# Patient Record
Sex: Male | Born: 1965 | Race: White | Hispanic: No | Marital: Married | Smoking: Former smoker
Health system: Southern US, Community
[De-identification: ages and names within clinical notes are randomized; demographics above are authoritative.]

## PROBLEM LIST (undated history)

## (undated) DIAGNOSIS — E119 Type 2 diabetes mellitus without complications: Secondary | ICD-10-CM

## (undated) DIAGNOSIS — C801 Malignant (primary) neoplasm, unspecified: Secondary | ICD-10-CM

## (undated) DIAGNOSIS — E785 Hyperlipidemia, unspecified: Secondary | ICD-10-CM

## (undated) HISTORY — PX: EYE SURGERY: SHX253

## (undated) HISTORY — PX: VASECTOMY: SHX75

## (undated) HISTORY — PX: WISDOM TOOTH EXTRACTION: SHX21

## (undated) HISTORY — DX: Malignant (primary) neoplasm, unspecified: C80.1

## (undated) HISTORY — DX: Hyperlipidemia, unspecified: E78.5

## (undated) HISTORY — DX: Type 2 diabetes mellitus without complications: E11.9

---

## 2017-07-19 ENCOUNTER — Encounter: Payer: Self-pay | Admitting: Gastroenterology

## 2017-09-09 ENCOUNTER — Ambulatory Visit (AMBULATORY_SURGERY_CENTER): Payer: Self-pay | Admitting: *Deleted

## 2017-09-09 VITALS — Ht 75.0 in | Wt 231.0 lb

## 2017-09-09 DIAGNOSIS — Z1211 Encounter for screening for malignant neoplasm of colon: Secondary | ICD-10-CM

## 2017-09-09 MED ORDER — NA SULFATE-K SULFATE-MG SULF 17.5-3.13-1.6 GM/177ML PO SOLN: 1.0000 | Freq: Once | ORAL | 0 refills | Status: AC

## 2017-09-09 NOTE — Progress Notes (Signed)
Patient denies any allergies to eggs or soy. Patient denies any problems with anesthesia/sedation. Patient denies any oxygen use at home. Patient denies taking any diet/weight loss medications or blood thinners. EMMI education offered, pt declined. Suprep covered by [employer].

## 2017-09-12 HISTORY — PX: COLONOSCOPY: SHX174

## 2017-09-23 ENCOUNTER — Encounter: Payer: Self-pay | Admitting: Gastroenterology

## 2017-09-23 ENCOUNTER — Ambulatory Visit (AMBULATORY_SURGERY_CENTER): Payer: PRIVATE HEALTH INSURANCE | Admitting: Gastroenterology

## 2017-09-23 VITALS — BP 101/66 | HR 73 | Temp 98.4°F | Resp 14 | Ht 75.0 in | Wt 231.0 lb

## 2017-09-23 DIAGNOSIS — D123 Benign neoplasm of transverse colon: Secondary | ICD-10-CM

## 2017-09-23 DIAGNOSIS — C182 Malignant neoplasm of ascending colon: Secondary | ICD-10-CM | POA: Diagnosis not present

## 2017-09-23 DIAGNOSIS — C187 Malignant neoplasm of sigmoid colon: Secondary | ICD-10-CM | POA: Diagnosis not present

## 2017-09-23 DIAGNOSIS — C801 Malignant (primary) neoplasm, unspecified: Secondary | ICD-10-CM

## 2017-09-23 DIAGNOSIS — D125 Benign neoplasm of sigmoid colon: Secondary | ICD-10-CM

## 2017-09-23 DIAGNOSIS — Z1211 Encounter for screening for malignant neoplasm of colon: Secondary | ICD-10-CM

## 2017-09-23 DIAGNOSIS — K635 Polyp of colon: Secondary | ICD-10-CM

## 2017-09-23 HISTORY — DX: Malignant (primary) neoplasm, unspecified: C80.1

## 2017-09-23 MED ORDER — SODIUM CHLORIDE 0.9 % IV SOLN: 500.0000 mL | Freq: Once | INTRAVENOUS | Status: DC

## 2017-09-23 NOTE — Progress Notes (Signed)
To PACU, VSS. Report to RN.tb 

## 2017-09-23 NOTE — Progress Notes (Signed)
Called to room to assist during endoscopic procedure.  Patient ID and intended procedure confirmed with present staff. Received instructions for my participation in the procedure from the performing physician.  

## 2017-09-23 NOTE — Patient Instructions (Signed)
Handouts given on polyps, diverticulosis and clip card   YOU HAD AN ENDOSCOPIC PROCEDURE TODAY AT [facility]:   Refer to the procedure report that was given to you for any specific questions about what was found during the examination.  If the procedure report does not answer your questions, please call your gastroenterologist to clarify.  If you requested that your care partner not be given the details of your procedure findings, then the procedure report has been included in a sealed envelope for you to review at your convenience later.  YOU SHOULD EXPECT: Some feelings of bloating in the abdomen. Passage of more gas than usual.  Walking can help get rid of the air that was put into your GI tract during the procedure and reduce the bloating. If you had a lower endoscopy (such as a colonoscopy or flexible sigmoidoscopy) you may notice spotting of blood in your stool or on the toilet paper. If you underwent a bowel prep for your procedure, you may not have a normal bowel movement for a few days.  Please Note:  You might notice some irritation and congestion in your nose or some drainage.  This is from the oxygen used during your procedure.  There is no need for concern and it should clear up in a day or so.  SYMPTOMS TO REPORT IMMEDIATELY:   Following lower endoscopy (colonoscopy or flexible sigmoidoscopy):  Excessive amounts of blood in the stool  Significant tenderness or worsening of abdominal pains  Swelling of the abdomen that is new, acute  Fever of 100F or higher   For urgent or emergent issues, a gastroenterologist can be reached at any hour by calling [phone].   DIET:  We do recommend a small meal at first, but then you may proceed to your regular diet.  Drink plenty of fluids but you should avoid alcoholic beverages for 24 hours.  ACTIVITY:  You should plan to take it easy for the rest of today and you should NOT DRIVE or use heavy machinery until tomorrow  (because of the sedation medicines used during the test).    FOLLOW UP: Our staff will call the number listed on your records the next business day following your procedure to check on you and address any questions or concerns that you may have regarding the information given to you following your procedure. If we do not reach you, we will leave a message.  However, if you are feeling well and you are not experiencing any problems, there is no need to return our call.  We will assume that you have returned to your regular daily activities without incident.  If any biopsies were taken you will be contacted by phone or by letter within the next 1-3 weeks.  Please call us at [phone] if you have not heard about the biopsies in 3 weeks.    SIGNATURES/CONFIDENTIALITY: You and/or your care partner have signed paperwork which will be entered into your electronic medical record.  These signatures attest to the fact that that the information above on your After Visit Summary has been reviewed and is understood.  Full responsibility of the confidentiality of this discharge information lies with you and/or your care-partner.

## 2017-09-23 NOTE — Progress Notes (Signed)
Pt's states no medical or surgical changes since previsit or office visit. 

## 2017-09-23 NOTE — Op Note (Signed)
[place] Patient Name: Luke Woodard Procedure Date: 09/23/2017 8:05 AM MRN: [id number] Endoscopist: Mallie Mussel L. [name] , MD Age: 52 Referring MD:  Date of Birth: 02-18-1965 Gender: Male Account #: [id number] Procedure:                Colonoscopy Indications:              Screening for colorectal malignant neoplasm, This                            is the patient's first colonoscopy Medicines:                Monitored Anesthesia Care Procedure:                Pre-Anesthesia Assessment:                           - Prior to the procedure, a History and Physical                            was performed, and patient medications and                            allergies were reviewed. The patient's tolerance of                            previous anesthesia was also reviewed. The risks                            and benefits of the procedure and the sedation                            options and risks were discussed with the patient.                            All questions were answered, and informed consent                            was obtained. Prior Anticoagulants: The patient has                            taken no previous anticoagulant or antiplatelet                            agents. ASA Grade Assessment: II - A patient with                            mild systemic disease. After reviewing the risks                            and benefits, the patient was deemed in                            satisfactory condition to undergo the procedure.  After obtaining informed consent, the colonoscope                            was passed under direct vision. Throughout the                            procedure, the patient's blood pressure, pulse, and                            oxygen saturations were monitored continuously. The                            Colonoscope was introduced through the anus and                            advanced to the the cecum,  identified by                            appendiceal orifice and ileocecal valve. The                            colonoscopy was performed without difficulty. The                            patient tolerated the procedure well. The quality                            of the bowel preparation was excellent. The                            ileocecal valve, appendiceal orifice, and rectum                            were photographed. Scope In: 8:13:00 AM Scope Out: 8:31:31 AM Scope Withdrawal Time: 0 hours 16 minutes 18 seconds  Total Procedure Duration: 0 hours 18 minutes 31 seconds  Findings:                 The perianal and digital rectal examinations were                            normal.                           A 2 mm polyp was found in the transverse colon. The                            polyp was sessile. The polyp was removed with a                            cold biopsy forceps. Resection and retrieval were                            complete.  Diverticula were found in the left colon.                           A 12 mm polyp was found in the distal sigmoid                            colon. The polyp was pedunculated (thick stalk).                            The polyp was removed with a hot snare. Resection                            and retrieval were complete. To prevent bleeding                            post-intervention, one hemostatic clip was                            successfully placed (MR conditional). There was no                            bleeding during the procedure.                           The exam was otherwise without abnormality on                            direct and retroflexion views. Complications:            No immediate complications. Estimated Blood Loss:     Estimated blood loss: none. Impression:               - One 2 mm polyp in the transverse colon, removed                            with a cold biopsy forceps. Resected  and retrieved.                           - Diverticulosis in the left colon.                           - One 12 mm polyp in the distal sigmoid colon,                            removed with a hot snare. Resected and retrieved.                            Clip (MR conditional) was placed.                           - The examination was otherwise normal on direct                            and retroflexion views. Recommendation:           -  Patient has a contact number available for                            emergencies. The signs and symptoms of potential                            delayed complications were discussed with the                            patient. Return to normal activities tomorrow.                            Written discharge instructions were provided to the                            patient.                           - Resume previous diet.                           - Continue present medications.                           - Await pathology results.                           - Repeat colonoscopy is recommended for                            surveillance. The colonoscopy date will be                            determined after pathology results from today's                            exam become available for review. Henry L. [name], MD 09/23/2017 8:35:48 AM This report has been signed electronically.

## 2017-09-24 ENCOUNTER — Telehealth: Payer: Self-pay | Admitting: *Deleted

## 2017-09-24 ENCOUNTER — Telehealth: Payer: Self-pay

## 2017-09-24 NOTE — Telephone Encounter (Signed)
  Follow up Call-  Call back number 09/23/2017  Post procedure Call Back phone  # [phone]  Permission to leave phone message Yes     The line is busy Angela/call-back [place]

## 2017-09-24 NOTE — Telephone Encounter (Signed)
  Follow up Call-  Call back number 09/23/2017  Post procedure Call Back phone  # [phone]  Permission to leave phone message Yes     Patient questions:  Do you have a fever, pain , or abdominal swelling? No. Pain Score  0 *  Have you tolerated food without any problems? Yes.    Have you been able to return to your normal activities? Yes.    Do you have any questions about your discharge instructions: Diet   No. Medications  No. Follow up visit  No.  Do you have questions or concerns about your Care? No.  Actions: * If pain score is 4 or above: No action needed, pain <4.

## 2017-09-27 ENCOUNTER — Other Ambulatory Visit: Payer: Self-pay

## 2017-09-27 DIAGNOSIS — C187 Malignant neoplasm of sigmoid colon: Secondary | ICD-10-CM

## 2017-10-01 ENCOUNTER — Ambulatory Visit (AMBULATORY_SURGERY_CENTER): Payer: PRIVATE HEALTH INSURANCE | Admitting: Gastroenterology

## 2017-10-01 ENCOUNTER — Encounter: Payer: Self-pay | Admitting: Gastroenterology

## 2017-10-01 VITALS — BP 124/81 | HR 76 | Temp 99.5°F | Resp 16 | Ht 75.0 in | Wt 231.0 lb

## 2017-10-01 DIAGNOSIS — K529 Noninfective gastroenteritis and colitis, unspecified: Secondary | ICD-10-CM | POA: Diagnosis not present

## 2017-10-01 DIAGNOSIS — C187 Malignant neoplasm of sigmoid colon: Secondary | ICD-10-CM | POA: Diagnosis not present

## 2017-10-01 DIAGNOSIS — K6389 Other specified diseases of intestine: Secondary | ICD-10-CM | POA: Diagnosis not present

## 2017-10-01 DIAGNOSIS — D125 Benign neoplasm of sigmoid colon: Secondary | ICD-10-CM

## 2017-10-01 DIAGNOSIS — K635 Polyp of colon: Secondary | ICD-10-CM

## 2017-10-01 MED ORDER — SODIUM CHLORIDE 0.9 % IV SOLN: 500.0000 mL | Freq: Once | INTRAVENOUS | Status: DC

## 2017-10-01 NOTE — Op Note (Signed)
[facility] Patient Name: Luke Woodard Procedure Date: 10/01/2017 3:02 PM MRN: [id number] Endoscopist: Mallie Mussel L. Loletha Woodard , MD Age: 52 Referring MD:  Date of Birth: 08-11-1965 Gender: Male Account #: [id number] Procedure:                Flexible Sigmoidoscopy Indications:              Personal history of malignant neoplasm of the colon                            - malignant polyp removed 09/23/17 on screening                            colonoscopy) Procedure performed to reassess and                            tattoo site Medicines:                Monitored Anesthesia Care Procedure:                Pre-Anesthesia Assessment:                           - Prior to the procedure, a History and Physical                            was performed, and patient medications and                            allergies were reviewed. The patient's tolerance of                            previous anesthesia was also reviewed. The risks                            and benefits of the procedure and the sedation                            options and risks were discussed with the patient.                            All questions were answered, and informed consent                            was obtained. Prior Anticoagulants: The patient has                            taken no previous anticoagulant or antiplatelet                            agents. ASA Grade Assessment: II - A patient with                            mild systemic disease. After reviewing the risks  and benefits, the patient was deemed in                            satisfactory condition to undergo the procedure.                           After obtaining informed consent, the scope was                            passed under direct vision. The Colonoscope was                            introduced through the anus and advanced to the the                            descending colon. The flexible sigmoidoscopy  was                            accomplished without difficulty. The patient                            tolerated the procedure well. The quality of the                            bowel preparation was good. Scope In: 3:12:28 PM Scope Out: 3:28:27 PM Scope Withdrawal Time: 0 hours 15 minutes 0 seconds  Total Procedure Duration: 0 hours 15 minutes 59 seconds  Findings:                 The perianal and digital rectal examinations were                            normal.                           A polypoid lesion was found in the mid sigmoid                            colon - about 30 cm from anal verge (small remnant                            of polyp stalk). The lesion was ulcerated from                            prior cautery. No bleeding was present, and the                            previously-placed clip was no longer present. The                            remainder of polyp stalk tissue was removed with a                            hot snare. Resection and retrieval were  complete.                            To prevent bleeding post-intervention, one                            hemostatic clip was successfully placed (MR                            conditional). There was no bleeding at the end of                            the maneuver. Area was tattooed with an injection                            of 2 mL of Spot - two 0.80ml injections 3-5 cm                            proximal to the site, and two 0.5 ml injections 3cm                            distal to the site. Complications:            No immediate complications. Estimated Blood Loss:     Estimated blood loss was minimal. Impression:               - Polypoid lesion in the mid sigmoid colon.                            Complete removal was accomplished. Clip (MR                            conditional) was placed. Tattooed. Recommendation:           - Patient has a contact number available for                             emergencies. The signs and symptoms of potential                            delayed complications were discussed with the                            patient. Return to normal activities tomorrow.                            Written discharge instructions were provided to the                            patient.                           - Resume previous diet.                           - Await pathology results.                           -  Surgical consultation to consider sigmoid                            resection. Luke L. [name], MD 10/01/2017 3:38:42 PM This report has been signed electronically.

## 2017-10-01 NOTE — Progress Notes (Signed)
To PACU, vss patent aw report to rn 

## 2017-10-01 NOTE — Patient Instructions (Signed)
YOU HAD AN ENDOSCOPIC PROCEDURE TODAY AT [facility]:   Refer to the procedure report that was given to you for any specific questions about what was found during the examination.  If the procedure report does not answer your questions, please call your gastroenterologist to clarify.  If you requested that your care partner not be given the details of your procedure findings, then the procedure report has been included in a sealed envelope for you to review at your convenience later.  YOU SHOULD EXPECT: Some feelings of bloating in the abdomen. Passage of more gas than usual.  Walking can help get rid of the air that was put into your GI tract during the procedure and reduce the bloating. If you had a lower endoscopy (such as a colonoscopy or flexible sigmoidoscopy) you may notice spotting of blood in your stool or on the toilet paper. If you underwent a bowel prep for your procedure, you may not have a normal bowel movement for a few days.  Please Note:  You might notice some irritation and congestion in your nose or some drainage.  This is from the oxygen used during your procedure.  There is no need for concern and it should clear up in a day or so.  SYMPTOMS TO REPORT IMMEDIATELY:   Following lower endoscopy (colonoscopy or flexible sigmoidoscopy):  Excessive amounts of blood in the stool  Significant tenderness or worsening of abdominal pains  Swelling of the abdomen that is new, acute  Fever of 100F or higher   For urgent or emergent issues, a gastroenterologist can be reached at any hour by calling [phone].   DIET:  We do recommend a small meal at first, but then you may proceed to your regular diet.  Drink plenty of fluids but you should avoid alcoholic beverages for 24 hours.  ACTIVITY:  You should plan to take it easy for the rest of today and you should NOT DRIVE or use heavy machinery until tomorrow (because of the sedation medicines used during the test).     FOLLOW UP: Our staff will call the number listed on your records the next business day following your procedure to check on you and address any questions or concerns that you may have regarding the information given to you following your procedure. If we do not reach you, we will leave a message.  However, if you are feeling well and you are not experiencing any problems, there is no need to return our call.  We will assume that you have returned to your regular daily activities without incident.  If any biopsies were taken you will be contacted by phone or by letter within the next 1-3 weeks.  Please call us at [phone] if you have not heard about the biopsies in 3 weeks.    SIGNATURES/CONFIDENTIALITY: You and/or your care partner have signed paperwork which will be entered into your electronic medical record.  These signatures attest to the fact that that the information above on your After Visit Summary has been reviewed and is understood.  Full responsibility of the confidentiality of this discharge information lies with you and/or your care-partner.   Card given is to identify location of clip.44  Surgical consult to consider sigmoid resection.

## 2017-10-01 NOTE — Progress Notes (Signed)
Called to room to assist during endoscopic procedure.  Patient ID and intended procedure confirmed with present staff. Received instructions for my participation in the procedure from the performing physician.  

## 2017-10-02 ENCOUNTER — Telehealth: Payer: Self-pay

## 2017-10-02 NOTE — Telephone Encounter (Signed)
  Follow up Call-  Call back number 10/01/2017 09/23/2017  Post procedure Call Back phone  # [phone]  Permission to leave phone message Yes Yes     Patient questions:  Do you have a fever, pain , or abdominal swelling? No. Pain Score  0 *  Have you tolerated food without any problems? Yes.    Have you been able to return to your normal activities? Yes.    Do you have any questions about your discharge instructions: Diet   No. Medications  No. Follow up visit  No.  Do you have questions or concerns about your Care? No.  Actions: * If pain score is 4 or above: No action needed, pain <4.

## 2017-10-10 ENCOUNTER — Other Ambulatory Visit: Payer: Self-pay

## 2017-10-10 DIAGNOSIS — C187 Malignant neoplasm of sigmoid colon: Secondary | ICD-10-CM

## 2017-10-11 ENCOUNTER — Telehealth: Payer: Self-pay

## 2017-10-11 NOTE — Telephone Encounter (Signed)
-----   Message from Arna Snipe, RN sent at 10/11/2017 10:29 AM EDT ----- Gweneth Fritter,  I looked at this referral and he will meet with surgeon first and if they don't operate first we'll get him in.  Thank you.  Dawn

## 2017-10-11 NOTE — Telephone Encounter (Signed)
Called [facility], left message for Judson Roch to see if they have schedule this patient an appointment with Dr. Dema Severin yet.

## 2017-10-17 ENCOUNTER — Telehealth: Payer: Self-pay | Admitting: Gastroenterology

## 2017-10-17 NOTE — Telephone Encounter (Signed)
-----   Message from Arna Snipe, RN sent at 10/16/2017  2:50 PM EDT ----- Dr. [name],  I spoke with Dr. Benay Spice. He said that there is no reason for an oncology consult because there would be no treatment and that it is a surgical and GI decision.  Patients who have malignant path go to surgeon first (unless they have mets) and we see them three weeks post op. All four of our GI surgeons were present this morning when the pathology was discussed and the consensus was no surgery.  Dawn  ----- Message ----- From: Doran Stabler, MD Sent: 10/16/2017   1:46 PM EDT To: Arna Snipe, RN  Dawn,    Dr. Ardis Hughs tells me this patient's case was presented at [facility] this AM.  I wish I had known that, as I would have attended.    At any rate, I understand the consensus was not to proceed with any further therapy, but to obtain staging CT scans and a CEA.    If this means the patient does not have a formal in-office oncology consultation, then how do I receive some formal documented recommendations to that effect before I contact Mr. Carte?  - Wilfrid Lund, MD

## 2017-10-17 NOTE — Telephone Encounter (Signed)
I gave Luke Woodard the recommendations of the oncology multidisciplinary conference from this week. I also let him know the MSI testing of initial polyp pathology returned normal.  No surgery or other cancer therapy were recommended.   However, CT chest/abdomen/pelvis with oral and IV contrast as well as a CEA level were recommended as baseline staging since this is a cancer diagnosis.  Luke Woodard is glad to hear the news, and will wait to hear from you regarding the CT scan.  Although he lives in [place], he is willing to come to [place] for the testing.  It would be convenient for him to have the CEA lab draw same day as scan.

## 2017-10-18 ENCOUNTER — Other Ambulatory Visit: Payer: Self-pay

## 2017-10-18 ENCOUNTER — Telehealth: Payer: Self-pay

## 2017-10-18 DIAGNOSIS — C187 Malignant neoplasm of sigmoid colon: Secondary | ICD-10-CM

## 2017-10-18 NOTE — Telephone Encounter (Signed)
CT oral contrast and instructions left at our front desk.

## 2017-10-18 NOTE — Telephone Encounter (Signed)
Called [facility] and cancelled patient's appointment.

## 2017-10-18 NOTE — Telephone Encounter (Signed)
Patient aware that [facility] will contact him to schedule CT C/A/P, he will come by and do lab work here too.

## 2017-10-18 NOTE — Telephone Encounter (Signed)
-----   Message from [place], MD sent at 10/18/2017 12:30 PM EDT ----- Please call [facility] and cancel this patient's appointment for next Monday.  He no longer needs to see the surgeon (Dr. Dema Severin).  - HD

## 2017-10-18 NOTE — Telephone Encounter (Signed)
Pt is scheduled for CT on 11-07-17 and going to come by here and have his labwork done.  He needs his contrast and does not want to drive to [facility] if he does not have too

## 2017-10-31 ENCOUNTER — Telehealth: Payer: Self-pay | Admitting: Gastroenterology

## 2017-10-31 NOTE — Telephone Encounter (Signed)
Spoke to patient and there is no way to mail the prep to him, patient will have to come to our office or [facility] to pick up the prep. Suggested he also contact them (GI) to see if possible to pick up prep somewhere closer to his home. Or patient can drive here earlier in the day to pick up prep.

## 2017-10-31 NOTE — Telephone Encounter (Signed)
Patient requesting to speak with nurse julie about prepping for CT on 9.26.19 and getting his stuff sent to [facility]

## 2017-11-07 ENCOUNTER — Ambulatory Visit
Admission: RE | Admit: 2017-11-07 | Discharge: 2017-11-07 | Disposition: A | Discharge status: Home / Self Care | Payer: PRIVATE HEALTH INSURANCE | Source: Ambulatory Visit | Attending: Gastroenterology | Admitting: Gastroenterology

## 2017-11-07 DIAGNOSIS — C187 Malignant neoplasm of sigmoid colon: Secondary | ICD-10-CM

## 2017-11-07 MED ORDER — IOPAMIDOL (ISOVUE-300) INJECTION 61%
125.0000 mL | Freq: Once | INTRAVENOUS | Status: AC | PRN
  Administered 2017-11-07: 125 mL via INTRAVENOUS

## 2018-04-12 ENCOUNTER — Encounter: Payer: Self-pay | Admitting: Gastroenterology

## 2018-09-17 ENCOUNTER — Encounter: Payer: Self-pay | Admitting: Gastroenterology

## 2018-09-29 ENCOUNTER — Encounter: Payer: PRIVATE HEALTH INSURANCE | Admitting: Gastroenterology

## 2018-11-07 ENCOUNTER — Encounter: Payer: Self-pay | Admitting: Gastroenterology

## 2018-12-08 ENCOUNTER — Other Ambulatory Visit: Payer: Self-pay

## 2018-12-08 ENCOUNTER — Ambulatory Visit (AMBULATORY_SURGERY_CENTER): Payer: PRIVATE HEALTH INSURANCE | Admitting: *Deleted

## 2018-12-08 ENCOUNTER — Encounter: Payer: Self-pay | Admitting: Gastroenterology

## 2018-12-08 VITALS — Ht 75.0 in | Wt 230.0 lb

## 2018-12-08 DIAGNOSIS — Z1159 Encounter for screening for other viral diseases: Secondary | ICD-10-CM

## 2018-12-08 DIAGNOSIS — Z1211 Encounter for screening for malignant neoplasm of colon: Secondary | ICD-10-CM

## 2018-12-08 MED ORDER — SUPREP BOWEL PREP KIT 17.5-3.13-1.6 GM/177ML PO SOLN: 1.0000 | Freq: Once | ORAL | 0 refills | Status: AC

## 2018-12-08 NOTE — Progress Notes (Signed)
Pt verified name, DOB, address and insurance during PV today. Pt mailed instruction packet to included paper to complete and mail back to [facility] with addressed and stamped envelope, Emmi video, copy of consent form to read and not return, and instructions. Suprep coupon mailed in packet. PV completed over the phone. Pt encouraged to call with questions or issues   Patient denies any allergies to egg or soy products. Patient denies complications with anesthesia/sedation.  Patient denies oxygen use at home and denies diet medications.

## 2018-12-08 NOTE — Addendum Note (Signed)
Addended by: Berneta Sages on: 12/08/2018 11:43 AM   Modules accepted: Orders

## 2018-12-13 ENCOUNTER — Encounter (HOSPITAL_COMMUNITY): Payer: Self-pay

## 2018-12-13 ENCOUNTER — Other Ambulatory Visit: Payer: Self-pay

## 2018-12-13 ENCOUNTER — Emergency Department (HOSPITAL_COMMUNITY): Payer: PRIVATE HEALTH INSURANCE

## 2018-12-13 ENCOUNTER — Emergency Department (HOSPITAL_COMMUNITY)
Admission: EM | Admit: 2018-12-13 | Discharge: 2018-12-13 | Disposition: A | Discharge status: Home / Self Care | Payer: PRIVATE HEALTH INSURANCE | Attending: Emergency Medicine | Admitting: Emergency Medicine

## 2018-12-13 DIAGNOSIS — Z87891 Personal history of nicotine dependence: Secondary | ICD-10-CM | POA: Insufficient documentation

## 2018-12-13 DIAGNOSIS — E119 Type 2 diabetes mellitus without complications: Secondary | ICD-10-CM | POA: Diagnosis not present

## 2018-12-13 DIAGNOSIS — K1120 Sialoadenitis, unspecified: Secondary | ICD-10-CM

## 2018-12-13 DIAGNOSIS — Z7984 Long term (current) use of oral hypoglycemic drugs: Secondary | ICD-10-CM | POA: Insufficient documentation

## 2018-12-13 DIAGNOSIS — R220 Localized swelling, mass and lump, head: Secondary | ICD-10-CM

## 2018-12-13 DIAGNOSIS — Z79899 Other long term (current) drug therapy: Secondary | ICD-10-CM | POA: Diagnosis not present

## 2018-12-13 LAB — COMPREHENSIVE METABOLIC PANEL
ALT: 32 U/L (ref 0–44)
AST: 22 U/L (ref 15–41)
Albumin: 4.3 g/dL (ref 3.5–5.0)
Alkaline Phosphatase: 62 U/L (ref 38–126)
Anion gap: 12 (ref 5–15)
BUN: 8 mg/dL (ref 6–20)
CO2: 24 mmol/L (ref 22–32)
Calcium: 9.4 mg/dL (ref 8.9–10.3)
Chloride: 102 mmol/L (ref 98–111)
Creatinine, Ser: 0.97 mg/dL (ref 0.61–1.24)
GFR calc Af Amer: >60 mL/min (ref >60)
GFR calc non Af Amer: >60 mL/min (ref >60)
Glucose, Bld: 273 mg/dL — ABNORMAL HIGH (ref 70–99)
Potassium: 3.4 mmol/L — ABNORMAL LOW (ref 3.5–5.1)
Sodium: 138 mmol/L (ref 135–145)
Total Bilirubin: 1 mg/dL (ref 0.3–1.2)
Total Protein: 7.4 g/dL (ref 6.5–8.1)

## 2018-12-13 LAB — CBC WITH DIFFERENTIAL/PLATELET
Abs Immature Granulocytes: 0.05 10*3/uL (ref 0.00–0.07)
Basophils Absolute: 0 10*3/uL (ref 0.0–0.1)
Basophils Relative: 0 %
Eosinophils Absolute: 0 10*3/uL (ref 0.0–0.5)
Eosinophils Relative: 0 %
HCT: 42.8 % (ref 39.0–52.0)
Hemoglobin: 15.2 g/dL (ref 13.0–17.0)
Immature Granulocytes: 0 %
Lymphocytes Relative: 12 %
Lymphs Abs: 1.4 10*3/uL (ref 0.7–4.0)
MCH: 31 pg (ref 26.0–34.0)
MCHC: 35.5 g/dL (ref 30.0–36.0)
MCV: 87.3 fL (ref 80.0–100.0)
Monocytes Absolute: 0.9 10*3/uL (ref 0.1–1.0)
Monocytes Relative: 7 %
Neutro Abs: 9.8 10*3/uL — ABNORMAL HIGH (ref 1.7–7.7)
Neutrophils Relative %: 81 %
Platelets: 151 10*3/uL (ref 150–400)
RBC: 4.9 MIL/uL (ref 4.22–5.81)
RDW: 13.4 % (ref 11.5–15.5)
WBC: 12.2 10*3/uL — ABNORMAL HIGH (ref 4.0–10.5)
nRBC: 0 % (ref 0.0–0.2)

## 2018-12-13 LAB — LIPASE, BLOOD: Lipase: 38 U/L (ref 11–51)

## 2018-12-13 MED ORDER — OXYCODONE-ACETAMINOPHEN 5-325 MG PO TABS
2.0000 | ORAL_TABLET | Freq: Once | ORAL | Status: AC
  Administered 2018-12-13: 2 via ORAL
  Filled 2018-12-13: qty 2

## 2018-12-13 MED ORDER — OXYCODONE-ACETAMINOPHEN 5-325 MG PO TABS: 1.0000 | ORAL_TABLET | Freq: Three times a day (TID) | ORAL | 0 refills | Status: DC | PRN

## 2018-12-13 MED ORDER — METHYLPREDNISOLONE 4 MG PO TBPK: ORAL_TABLET | ORAL | 0 refills | Status: DC

## 2018-12-13 MED ORDER — CLINDAMYCIN HCL 300 MG PO CAPS: 300.0000 mg | ORAL_CAPSULE | Freq: Three times a day (TID) | ORAL | 0 refills | Status: DC

## 2018-12-13 MED ORDER — DEXAMETHASONE SODIUM PHOSPHATE 10 MG/ML IJ SOLN
10.0000 mg | Freq: Once | INTRAMUSCULAR | Status: AC
  Administered 2018-12-13: 10 mg via INTRAVENOUS
  Filled 2018-12-13: qty 1

## 2018-12-13 MED ORDER — SODIUM CHLORIDE 0.9 % IV SOLN
3.0000 g | Freq: Once | INTRAVENOUS | Status: AC
  Administered 2018-12-13: 3 g via INTRAVENOUS
  Filled 2018-12-13: qty 8

## 2018-12-13 MED ORDER — IOHEXOL 300 MG/ML  SOLN
75.0000 mL | Freq: Once | INTRAMUSCULAR | Status: AC | PRN
  Administered 2018-12-13: 75 mL via INTRAVENOUS

## 2018-12-13 MED ORDER — CLINDAMYCIN HCL 150 MG PO CAPS
600.0000 mg | ORAL_CAPSULE | Freq: Once | ORAL | Status: AC
  Administered 2018-12-13: 600 mg via ORAL
  Filled 2018-12-13: qty 4

## 2018-12-13 NOTE — ED Provider Notes (Signed)
[facility] EMERGENCY DEPARTMENT Provider Note   CSN: [id number] Arrival date & time: 12/13/18  1257     History   Chief Complaint Chief Complaint  Patient presents with  . Facial Swelling    HPI Chais Berger is a 53 y.o. male.     HPI  53 year old male history of type 2 diabetes, colon cancer, hyperlipidemia presents today complaining of left facial swelling.  He states this began on Wednesday.  He was seen by his primary care doctor and it was thought to be a parotid swelling.  He has been trying to suck on sour things and he gets increased swelling with that.  However, he is having increasing pain and swelling.  He feels like the swelling is getting lower in his neck.  He denies difficulty speaking, swallowing, or breathing.  He has not had fever or chills.  Past Medical History:  Diagnosis Date  . Cancer ([place]) 09/23/2017   malignant tumor of sigmoid colon  . Diabetes mellitus without complication ([place])    type 2 - On DM meds and lisinopril for sugar in kidneys per his MD  . Hyperlipidemia     There are no active problems to display for this patient.   Past Surgical History:  Procedure Laterality Date  . COLONOSCOPY  09/2017   Denies polyps  . EYE SURGERY     strabismus as a child  . VASECTOMY    . WISDOM TOOTH EXTRACTION          Home Medications    Prior to Admission medications   Medication Sig Start Date End Date Taking? Authorizing Provider  acetaminophen (TYLENOL) 500 MG tablet Take 1,000 mg by mouth every 6 (six) hours as needed (pain).   Yes [provider]  amoxicillin-clavulanate (AUGMENTIN) 875-125 MG tablet Take 1 tablet by mouth every 12 (twelve) hours. 12/11/18  Yes [provider]  finasteride (PROPECIA) 1 MG tablet Take 1 mg by mouth every morning.    Yes [provider]  ibuprofen (ADVIL) 200 MG tablet Take 800 mg by mouth every 6 (six) hours as needed (pain).   Yes [provider]   lisinopril (PRINIVIL,ZESTRIL) 10 MG tablet Take 10 mg by mouth every morning.  07/09/17  Yes [provider]  loratadine (CLARITIN) 10 MG tablet Take 10 mg by mouth every morning.    Yes [provider]  metFORMIN (GLUCOPHAGE) 1000 MG tablet Take 1,000 mg by mouth daily with breakfast.   Yes [provider]  Multiple Vitamins-Minerals (MULTIVITAMIN GUMMIES MENS PO) Take 2 tablets by mouth every morning.    Yes [provider]  pravastatin (PRAVACHOL) 10 MG tablet Take 10 mg by mouth every morning.  07/09/17  Yes [provider]  sitaGLIPtin (JANUVIA) 100 MG tablet Take 100 mg by mouth every morning.   Yes [provider]    Family History Family History  Adopted: Yes  Family history unknown: Yes    Social History Social History   Tobacco Use  . Smoking status: Former Smoker    Packs/day: 0.50    Years: 7.00    Pack years: 3.50    Types: Cigarettes    Quit date: 02/12/1994    Years since quitting: 24.8  . Smokeless tobacco: Never Used  Substance Use Topics  . Alcohol use: Yes    Comment: socially   . Drug use: Never     Allergies   Patient has no known allergies.   Review of Systems  Review of Systems  All other systems reviewed and are negative.    Physical Exam Updated Vital Signs BP (!) 156/95   Pulse (!) 101   Temp (!) 97.4 F (36.3 C) (Oral)   Resp 18   Ht 1.905 m (6\' 3" )   Wt 104.3 kg   SpO2 98%   BMI 28.75 kg/m   Physical Exam Vitals signs and nursing note reviewed.  Constitutional:      General: He is not in acute distress.    Appearance: Normal appearance.  HENT:     Head: Normocephalic.     Right Ear: External ear normal.     Left Ear: External ear normal.     Nose: Nose normal.     Mouth/Throat:     Mouth: Mucous membranes are moist.     Comments: Left facial swelling consistent with parotid location Unable to milk gland or identify stone Eyes:     Pupils: Pupils are equal, round, and  reactive to light.  Neck:     Musculoskeletal: Normal range of motion.  Cardiovascular:     Rate and Rhythm: Normal rate and regular rhythm.     Pulses: Normal pulses.  Pulmonary:     Effort: Pulmonary effort is normal.     Breath sounds: Normal breath sounds.  Abdominal:     General: Abdomen is flat.  Musculoskeletal: Normal range of motion.  Skin:    General: Skin is warm.  Neurological:     General: No focal deficit present.     Mental Status: He is alert.  Psychiatric:        Mood and Affect: Mood normal.      ED Treatments / Results  Labs (all labs ordered are listed, but only abnormal results are displayed) Labs Reviewed  CBC WITH DIFFERENTIAL/PLATELET  COMPREHENSIVE METABOLIC PANEL  LIPASE, BLOOD    EKG None  Radiology No results found.  Procedures Procedures (including critical care time)  Medications Ordered in ED Medications - No data to display   Initial Impression / Assessment and Plan / ED Course  I have reviewed the triage vital signs and the nursing notes.  Pertinent labs & imaging results that were available during my care of the patient were reviewed by me and considered in my medical decision making (see chart for details).        53 year old male presents today with left facial swelling consistent with parotitis.  CT scan is pending.  Is receiving 3 g of Unasyn IV.  white blood cell count is mildly elevated white blood cell count ongoing swelling and pain.  There is no overlying erythema no other indications this is infected. Discussed with Dr. Kathrynn Humble and will see and disposition after ct  Final Clinical Impressions(s) / ED Diagnoses   Final diagnoses:  Parotitis    ED Discharge Orders    None       Pattricia Boss, MD 12/13/18 1515

## 2018-12-13 NOTE — ED Triage Notes (Signed)
Pt arrived POV for left swollen jawx3 days. Pt went to PCP and they told him it was an impacted parotid gland in left side of jaw and was given Augmentin antibiotic for prophylaxis. Pt states he tried to suck on lemons to help, but his left jaw got more swollen.  Pt is A&Ox4. Airway is patent. Breathing is WNL. Pt denies SOB or any trouble breathing. Pt's bp slightly elevated, but VS WNL. Pt states he has trouble eating and has eaten very little in the past few days. Pt's left jaw has 2+ edema.

## 2018-12-13 NOTE — ED Notes (Signed)
Patient transported to CT 

## 2018-12-13 NOTE — Discharge Instructions (Signed)
Please follow-up with the ENT doctor by calling them on Monday for closest appointment. Dr. Blenda Nicely is aware of your condition and has requested that you be seen either on Monday or Tuesday by her.  Please take the antibiotics prescribed.  You can stop taking Augmentin.  Also take ibuprofen 600 mg along with the prednisone taper and Percocet as needed for pain control.  Return to the ER immediately if you start having difficulty in swallowing, difficulty in breathing, wheezing sound when you breathe.

## 2018-12-16 ENCOUNTER — Telehealth: Payer: Self-pay | Admitting: Gastroenterology

## 2018-12-16 NOTE — Telephone Encounter (Signed)
Pt informed he is ok to proceed on Prednisone with no issues- he asked about Covid test- informed NOT tent at [facility]- we discussed location, inside the white building [address]- he verbalized understanding

## 2018-12-16 NOTE — Telephone Encounter (Signed)
Patient is calling because he was seen at the ED this past weekend because of facial swelling and they end up finding out that he has a tumor behind his Salivary Gland. Patient is taking antibiotics and will be finished with them on Friday. Patient talked to a doctor at the [facility] in [place] and he told the patient that he would be fine to still have his colonoscopy on 11/9 but the patient wanted to call and have it okayed by Dr. [name]. Patient is scheduled for COVID test tomorrow and wants to know if he is okay to proceed because he lives in [place] and does not want to drive all the way down if he will have to reschedule.

## 2018-12-17 ENCOUNTER — Other Ambulatory Visit: Payer: Self-pay | Admitting: Gastroenterology

## 2018-12-18 LAB — SARS CORONAVIRUS 2 (TAT 6-24 HRS): SARS Coronavirus 2: NEGATIVE

## 2018-12-22 ENCOUNTER — Ambulatory Visit (AMBULATORY_SURGERY_CENTER): Payer: PRIVATE HEALTH INSURANCE | Admitting: Gastroenterology

## 2018-12-22 ENCOUNTER — Other Ambulatory Visit: Payer: Self-pay

## 2018-12-22 ENCOUNTER — Encounter: Payer: Self-pay | Admitting: Gastroenterology

## 2018-12-22 VITALS — BP 98/60 | HR 81 | Temp 98.1°F | Resp 16 | Ht 75.0 in | Wt 230.0 lb

## 2018-12-22 DIAGNOSIS — Z85038 Personal history of other malignant neoplasm of large intestine: Secondary | ICD-10-CM

## 2018-12-22 MED ORDER — SODIUM CHLORIDE 0.9 % IV SOLN: 500.0000 mL | Freq: Once | INTRAVENOUS | Status: DC

## 2018-12-22 NOTE — Op Note (Addendum)
[facility] Patient Name: Luke Woodard Procedure Date: 12/22/2018 7:19 AM MRN: [id number] Endoscopist: Mallie Mussel L. 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 CTAP nml,                            oncology recommended close endoscopic surveillance                            - originally planned for 6 month interval) Medicines:                Monitored Anesthesia Care Procedure:                Pre-Anesthesia Assessment:                           - Prior to the procedure, a History and Physical                            was performed, and patient medications and                            allergies were reviewed. The patient's tolerance of                            previous anesthesia was also reviewed. The risks                            and benefits of the procedure and the sedation                            options and risks were discussed with the patient.                            All questions were answered, and informed consent                            was obtained. Prior Anticoagulants: The patient has                            taken no previous anticoagulant or antiplatelet                            agents. ASA Grade Assessment: II - A patient with  mild systemic disease. After reviewing the risks                            and benefits, the patient was deemed in                            satisfactory condition to undergo the procedure.                           After obtaining informed consent, the colonoscope                            was passed under direct vision. Throughout the                            procedure, the patient's blood  pressure, pulse, and                            oxygen saturations were monitored continuously. The                            Colonoscope was introduced through the anus and                            advanced to the the cecum, identified by                            appendiceal orifice and ileocecal valve. The                            colonoscopy was performed without difficulty. The                            patient tolerated the procedure well. The quality                            of the bowel preparation was excellent. The                            ileocecal valve, appendiceal orifice, and rectum                            were photographed. Scope In: 8:09:44 AM Scope Out: 8:24:22 AM Scope Withdrawal Time: 0 hours 11 minutes 36 seconds  Total Procedure Duration: 0 hours 14 minutes 38 seconds  Findings:                 The perianal and digital rectal examinations were                            normal.                           A tattoo was seen in the mid sigmoid colon. A scar  was found at the tattoo site. No residual or                            recurrent polyp tissue seen (normal folds of mucosa                            between two small scars - viewed with WL and NBI)                           The exam was otherwise without abnormality on                            direct and retroflexion views. Complications:            No immediate complications. Estimated Blood Loss:     Estimated blood loss: none. Impression:               - A tattoo was seen in the mid sigmoid colon. A                            scar was found at the tattoo site.                           - The examination was otherwise normal on direct                            and retroflexion views.                           - No specimens collected. Recommendation:           - Patient has a contact number available for                            emergencies. The signs and symptoms  of potential                            delayed complications were discussed with the                            patient. Return to normal activities tomorrow.                            Written discharge instructions were provided to the                            patient.                           - Resume previous diet.                           - Continue present medications.                           - Repeat colonoscopy in 1 year for  surveillance.  L. [name], MD 12/22/2018 8:39:16 AM This report has been signed electronically.

## 2018-12-22 NOTE — Progress Notes (Signed)
Pt tolerated well. VSS. [place] and to recovery.

## 2018-12-22 NOTE — Patient Instructions (Signed)
Thank you for allowing [place] to care for you today!  Resume previous diet and medications today.  Return to your normal activities tomorrow.  Recommend follow-up colonoscopy in one year, we will send a reminder.    YOU HAD AN ENDOSCOPIC PROCEDURE TODAY AT [facility]:   Refer to the procedure report that was given to you for any specific questions about what was found during the examination.  If the procedure report does not answer your questions, please call your gastroenterologist to clarify.  If you requested that your care partner not be given the details of your procedure findings, then the procedure report has been included in a sealed envelope for you to review at your convenience later.  YOU SHOULD EXPECT: Some feelings of bloating in the abdomen. Passage of more gas than usual.  Walking can help get rid of the air that was put into your GI tract during the procedure and reduce the bloating. If you had a lower endoscopy (such as a colonoscopy or flexible sigmoidoscopy) you may notice spotting of blood in your stool or on the toilet paper. If you underwent a bowel prep for your procedure, you may not have a normal bowel movement for a few days.  Please Note:  You might notice some irritation and congestion in your nose or some drainage.  This is from the oxygen used during your procedure.  There is no need for concern and it should clear up in a day or so.  SYMPTOMS TO REPORT IMMEDIATELY:   Following lower endoscopy (colonoscopy or flexible sigmoidoscopy):  Excessive amounts of blood in the stool  Significant tenderness or worsening of abdominal pains  Swelling of the abdomen that is new, acute  Fever of 100F or higher   For urgent or emergent issues, a gastroenterologist can be reached at any hour by calling [phone].   DIET:  We do recommend a small meal at first, but then you may proceed to your regular diet.  Drink plenty of fluids but you should avoid  alcoholic beverages for 24 hours.  ACTIVITY:  You should plan to take it easy for the rest of today and you should NOT DRIVE or use heavy machinery until tomorrow (because of the sedation medicines used during the test).    FOLLOW UP: Our staff will call the number listed on your records 48-72 hours following your procedure to check on you and address any questions or concerns that you may have regarding the information given to you following your procedure. If we do not reach you, we will leave a message.  We will attempt to reach you two times.  During this call, we will ask if you have developed any symptoms of COVID 19. If you develop any symptoms (ie: fever, flu-like symptoms, shortness of breath, cough etc.) before then, please call [phone].  If you test positive for Covid 19 in the 2 weeks post procedure, please call and report this information to [place].    If any biopsies were taken you will be contacted by phone or by letter within the next 1-3 weeks.  Please call us at [phone] if you have not heard about the biopsies in 3 weeks.    SIGNATURES/CONFIDENTIALITY: You and/or your care partner have signed paperwork which will be entered into your electronic medical record.  These signatures attest to the fact that that the information above on your After Visit Summary has been reviewed and is understood.  Full responsibility of  the confidentiality of this discharge information lies with you and/or your care-partner. 

## 2018-12-22 NOTE — Progress Notes (Signed)
Vitals-[place] Temp-98.1  Pt's states no medical or surgical changes since previsit or office visit.

## 2018-12-24 ENCOUNTER — Telehealth: Payer: Self-pay | Admitting: *Deleted

## 2018-12-24 NOTE — Telephone Encounter (Signed)
  Follow up Call-  Call back number 12/22/2018 10/01/2017 09/23/2017  Post procedure Call Back phone  # [phone]  Permission to leave phone message Yes Yes Yes     Patient questions:  Message left to call us if necessary.  Second call.

## 2018-12-24 NOTE — Telephone Encounter (Signed)
Pt returning your call and said he is doing good from his procedure

## 2018-12-24 NOTE — Telephone Encounter (Signed)
  Follow up Call-  Call back number 12/22/2018 10/01/2017 09/23/2017  Post procedure Call Back phone  # [phone]  Permission to leave phone message Yes Yes Yes    LMOM

## 2020-03-29 ENCOUNTER — Encounter: Payer: Self-pay | Admitting: Gastroenterology

## 2020-05-04 ENCOUNTER — Encounter: Payer: Self-pay | Admitting: Gastroenterology

## 2020-07-18 ENCOUNTER — Encounter: Payer: PRIVATE HEALTH INSURANCE | Admitting: Gastroenterology

## 2020-08-29 ENCOUNTER — Ambulatory Visit (AMBULATORY_SURGERY_CENTER): Payer: PRIVATE HEALTH INSURANCE | Admitting: *Deleted

## 2020-08-29 ENCOUNTER — Other Ambulatory Visit: Payer: Self-pay

## 2020-08-29 VITALS — Ht 75.5 in | Wt 215.0 lb

## 2020-08-29 DIAGNOSIS — Z85038 Personal history of other malignant neoplasm of large intestine: Secondary | ICD-10-CM

## 2020-08-29 MED ORDER — NA SULFATE-K SULFATE-MG SULF 17.5-3.13-1.6 GM/177ML PO SOLN: ORAL | 0 refills | Status: DC

## 2020-08-29 NOTE — Progress Notes (Signed)
Patient's pre-visit was done today over the phone with the patient due to COVID-19 pandemic. Name,DOB and address verified. Insurance verified. Patient denies any allergies to Eggs and Soy. Patient denies any problems with anesthesia/sedation. Patient denies taking diet pills or blood thinners. No home Oxygen. Packet of Prep instructions mailed to patient including a copy of a consent form-pt is aware. Patient understands to call us back with any questions or concerns. Patient is aware of our care-partner policy and Covid-19 safety protocol.   EMMI education assigned to the patient for the procedure, sent to MyChart.   The patient is COVID-19 vaccinated, per patient.  

## 2020-09-09 ENCOUNTER — Other Ambulatory Visit: Payer: Self-pay

## 2020-09-09 ENCOUNTER — Encounter: Payer: Self-pay | Admitting: Gastroenterology

## 2020-09-09 ENCOUNTER — Ambulatory Visit (AMBULATORY_SURGERY_CENTER): Payer: PRIVATE HEALTH INSURANCE | Admitting: Gastroenterology

## 2020-09-09 VITALS — BP 125/87 | HR 79 | Temp 97.8°F | Resp 17 | Ht 75.5 in | Wt 215.0 lb

## 2020-09-09 DIAGNOSIS — Z85038 Personal history of other malignant neoplasm of large intestine: Secondary | ICD-10-CM

## 2020-09-09 MED ORDER — SODIUM CHLORIDE 0.9 % IV SOLN: 500.0000 mL | Freq: Once | INTRAVENOUS | Status: DC

## 2020-09-09 NOTE — Progress Notes (Signed)
Pt's states no medical or surgical changes since previsit or office visit. 

## 2020-09-09 NOTE — Progress Notes (Signed)
pt tolerated well. VSS. awake and to recovery. Report given to RN.  

## 2020-09-09 NOTE — Op Note (Signed)
[facility] Patient Name: Luke Woodard Procedure Date: 09/09/2020 9:30 AM MRN: [id number] Endoscopist: Mallie Mussel L. [name] , MD Age: 55 Referring MD:  Date of Birth: 17-Feb-1965 Gender: Male Account #: [id number] Procedure:                Colonoscopy Indications:              High risk colon cancer surveillance: Personal                            history of colon cancer                           malignant polyp 09/2017, no additional surgery or                            medical treatment                           12/2018 no polyps Medicines:                Monitored Anesthesia Care Procedure:                Pre-Anesthesia Assessment:                           - Prior to the procedure, a History and Physical                            was performed, and patient medications and                            allergies were reviewed. The patient's tolerance of                            previous anesthesia was also reviewed. The risks                            and benefits of the procedure and the sedation                            options and risks were discussed with the patient.                            All questions were answered, and informed consent                            was obtained. Prior Anticoagulants: The patient has                            taken no previous anticoagulant or antiplatelet                            agents. ASA Grade Assessment: III - A patient with  severe systemic disease. After reviewing the risks                            and benefits, the patient was deemed in                            satisfactory condition to undergo the procedure.                           After obtaining informed consent, the colonoscope                            was passed under direct vision. Throughout the                            procedure, the patient's blood pressure, pulse, and                            oxygen saturations were  monitored continuously. The                            CF [id number] was introduced through the anus                            and advanced to the the cecum, identified by                            appendiceal orifice and ileocecal valve. The                            colonoscopy was performed without difficulty. The                            patient tolerated the procedure well. The quality                            of the bowel preparation was excellent. The                            ileocecal valve, appendiceal orifice, and rectum                            were photographed. The bowel preparation used was                            SUPREP. Scope In: 9:37:34 AM Scope Out: 9:52:19 AM Scope Withdrawal Time: 0 hours 11 minutes 4 seconds  Total Procedure Duration: 0 hours 14 minutes 45 seconds  Findings:                 The perianal and digital rectal examinations were                            normal.  Two tattoos were seen in the distal sigmoid colon.                            A post-polypectomy scar was found between the                            tattoo sites. Healthy-appearing site. No polyp                            tissue seen there or elsewhere in the entire colon.                           The exam was otherwise without abnormality on                            direct and retroflexion views. Complications:            No immediate complications. Estimated Blood Loss:     Estimated blood loss: none. Impression:               - A tattoo was seen in the distal sigmoid colon. A                            post-polypectomy scar was found at the tattoo site.                            No poylps seen.                           - The examination was otherwise normal on direct                            and retroflexion views.                           - No specimens collected. Recommendation:           - Patient has a contact number available for                             emergencies. The signs and symptoms of potential                            delayed complications were discussed with the                            patient. Return to normal activities tomorrow.                            Written discharge instructions were provided to the                            patient.                           - Resume previous diet.                           -  Continue present medications.                           - Repeat colonoscopy in 3 years for surveillance. Luke Woodard L. [name], MD 09/09/2020 9:59:12 AM This report has been signed electronically.

## 2020-09-09 NOTE — Patient Instructions (Signed)
Resume previous diet and medications. Repeat Colonoscopy in 3 years for surveillance.  YOU HAD AN ENDOSCOPIC PROCEDURE TODAY AT [facility]:   Refer to the procedure report that was given to you for any specific questions about what was found during the examination.  If the procedure report does not answer your questions, please call your gastroenterologist to clarify.  If you requested that your care partner not be given the details of your procedure findings, then the procedure report has been included in a sealed envelope for you to review at your convenience later.  YOU SHOULD EXPECT: Some feelings of bloating in the abdomen. Passage of more gas than usual.  Walking can help get rid of the air that was put into your GI tract during the procedure and reduce the bloating. If you had a lower endoscopy (such as a colonoscopy or flexible sigmoidoscopy) you may notice spotting of blood in your stool or on the toilet paper. If you underwent a bowel prep for your procedure, you may not have a normal bowel movement for a few days.  Please Note:  You might notice some irritation and congestion in your nose or some drainage.  This is from the oxygen used during your procedure.  There is no need for concern and it should clear up in a day or so.  SYMPTOMS TO REPORT IMMEDIATELY:  Following lower endoscopy (colonoscopy or flexible sigmoidoscopy):  Excessive amounts of blood in the stool  Significant tenderness or worsening of abdominal pains  Swelling of the abdomen that is new, acute  Fever of 100F or higher  For urgent or emergent issues, a gastroenterologist can be reached at any hour by calling [phone]. Do not use MyChart messaging for urgent concerns.    DIET:  We do recommend a small meal at first, but then you may proceed to your regular diet.  Drink plenty of fluids but you should avoid alcoholic beverages for 24 hours.  ACTIVITY:  You should plan to take it easy for the  rest of today and you should NOT DRIVE or use heavy machinery until tomorrow (because of the sedation medicines used during the test).    FOLLOW UP: Our staff will call the number listed on your records 48-72 hours following your procedure to check on you and address any questions or concerns that you may have regarding the information given to you following your procedure. If we do not reach you, we will leave a message.  We will attempt to reach you two times.  During this call, we will ask if you have developed any symptoms of COVID 19. If you develop any symptoms (ie: fever, flu-like symptoms, shortness of breath, cough etc.) before then, please call [phone].  If you test positive for Covid 19 in the 2 weeks post procedure, please call and report this information to [place].    If any biopsies were taken you will be contacted by phone or by letter within the next 1-3 weeks.  Please call us at [phone] if you have not heard about the biopsies in 3 weeks.    SIGNATURES/CONFIDENTIALITY: You and/or your care partner have signed paperwork which will be entered into your electronic medical record.  These signatures attest to the fact that that the information above on your After Visit Summary has been reviewed and is understood.  Full responsibility of the confidentiality of this discharge information lies with you and/or your care-partner.

## 2020-09-13 ENCOUNTER — Telehealth: Payer: Self-pay

## 2020-09-13 NOTE — Telephone Encounter (Signed)
  Follow up Call-  Call back number 09/09/2020 12/22/2018  Post procedure Call Back phone  # [phone]  Permission to leave phone message Yes Yes  Some recent data might be hidden     Patient questions:  Do you have a fever, pain , or abdominal swelling? No. Pain Score  0 *  Have you tolerated food without any problems? Yes.    Have you been able to return to your normal activities? Yes.    Do you have any questions about your discharge instructions: Diet   No. Medications  No. Follow up visit  No.  Do you have questions or concerns about your Care? No.  Actions: * If pain score is 4 or above: No action needed, pain <4.  Have you developed a fever since your procedure? no  2.   Have you had an respiratory symptoms (SOB or cough) since your procedure? no  3.   Have you tested positive for COVID 19 since your procedure no  4.   Have you had any family members/close contacts diagnosed with the COVID 19 since your procedure?  no   If yes to any of these questions please route to Joylene John, RN and Joella Prince, RN

## 2021-01-20 IMAGING — CT CT MAXILLOFACIAL W/ CM
3 of 6 series · 15 of 47 positions shown, 18 images · IV contrast (APPLIED)
Comparison: None.

CLINICAL DATA: LEFT facial swelling for several days. History of
colon cancer.

EXAM:
CT MAXILLOFACIAL WITH CONTRAST
TECHNIQUE: Multidetector CT imaging of the maxillofacial structures was
performed with intravenous contrast. Multiplanar CT image
reconstructions were also generated.
CONTRAST:  75mL OMNIPAQUE IOHEXOL 300 MG/ML  SOLN

[Series 3: maxilllofacial 2.0 hr40 3 (person_name) · axial · 0.40mm/px · z∈[-192,-42]mm · 10 of 89 slices shown, 13 images]
[im 7/89  brain]
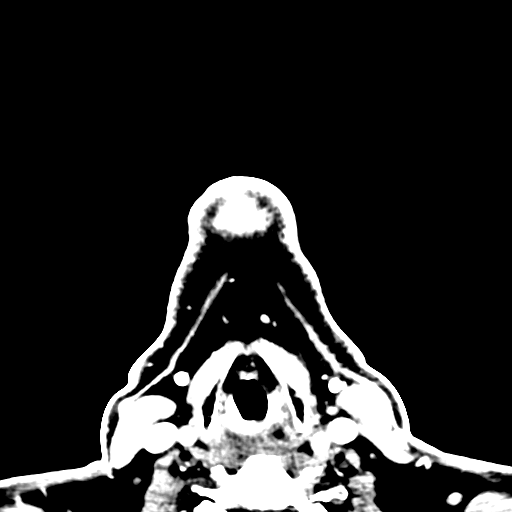
[im 7/89  bone]
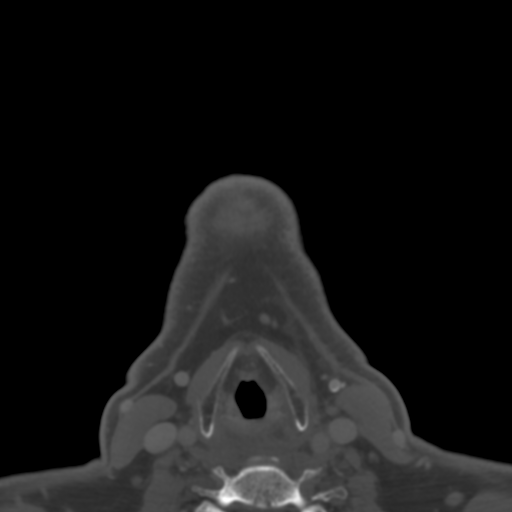
[im 13/89  bone]
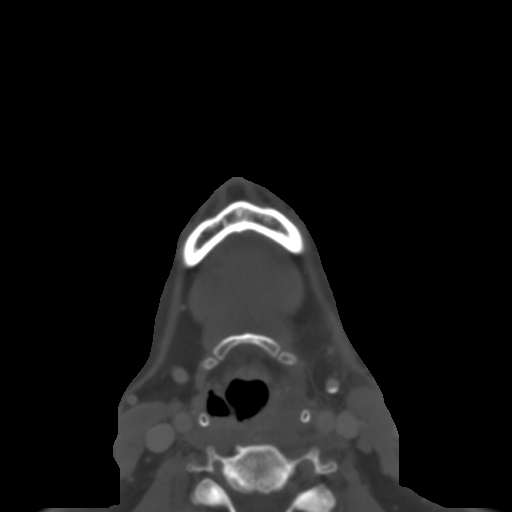
[im 26/89  bone]
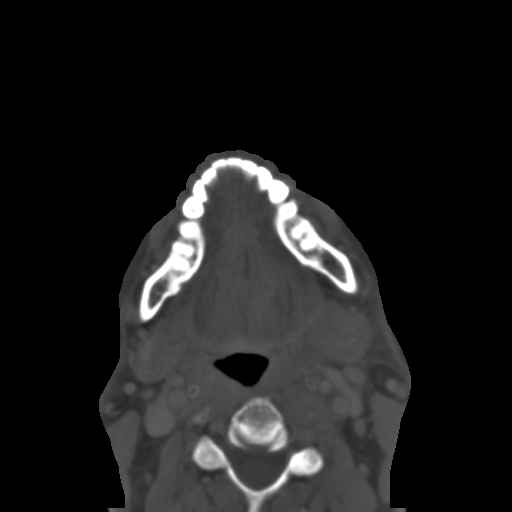
[im 32/89  bone]
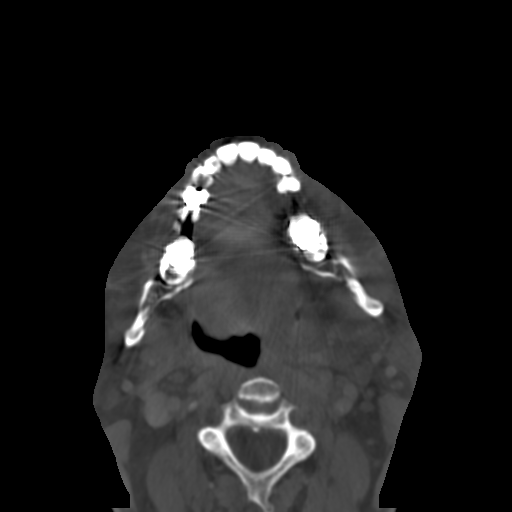
[im 38/89  brain]
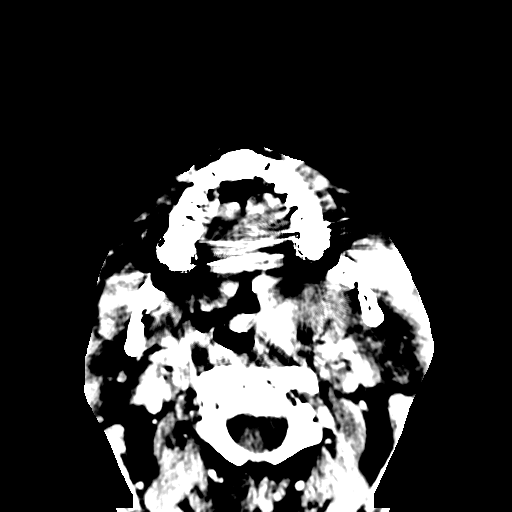
[im 38/89  bone]
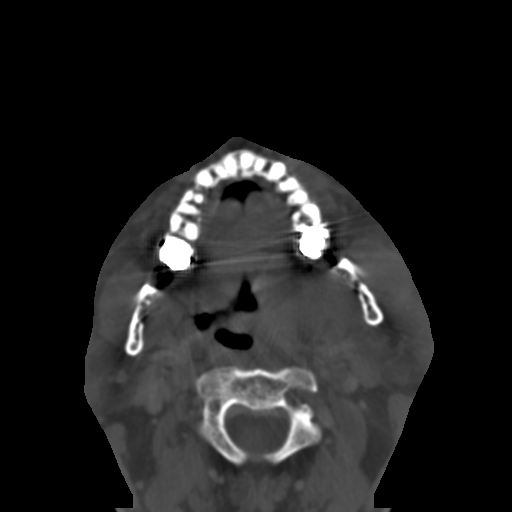
[im 51/89  bone]
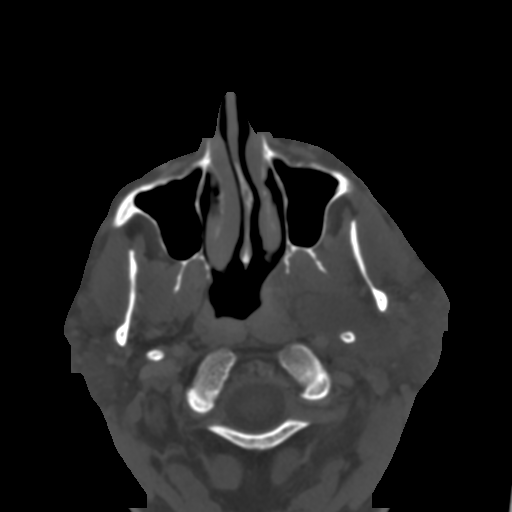
[im 57/89  bone]
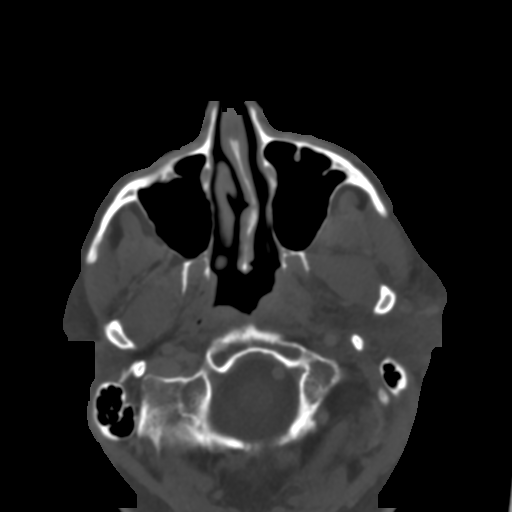
[im 63/89  bone]
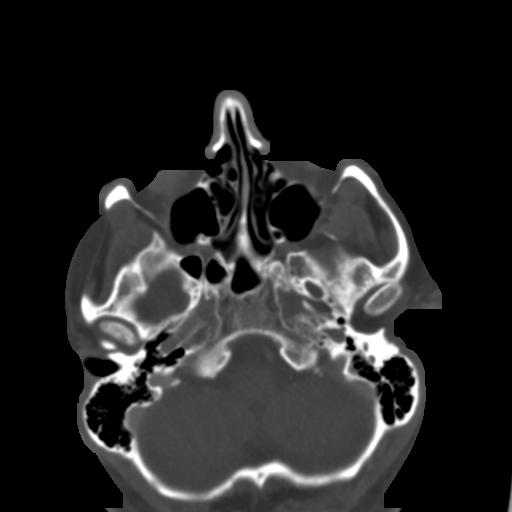
[im 76/89  brain]
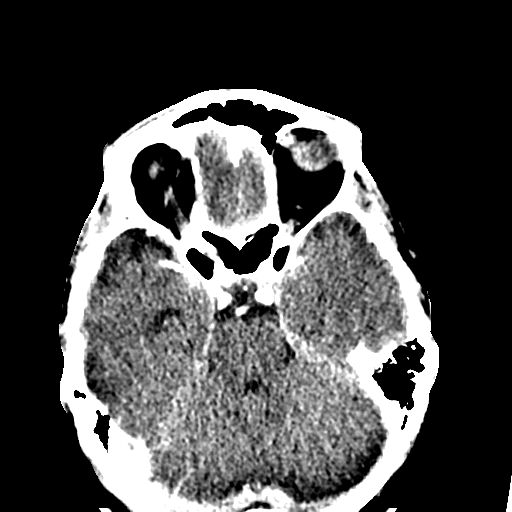
[im 76/89  bone]
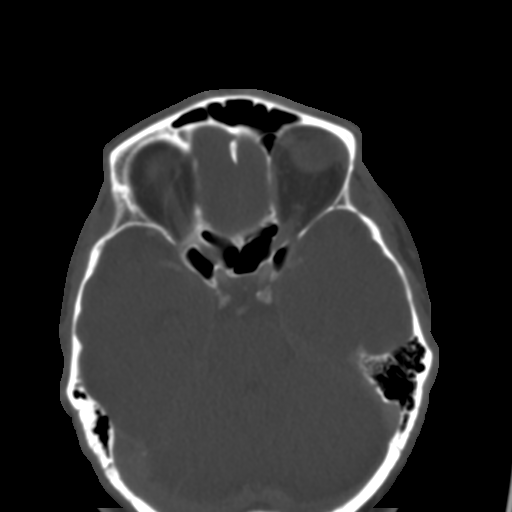
[im 82/89  bone]
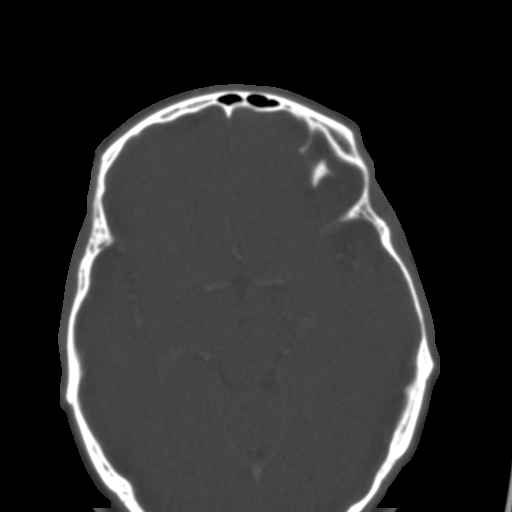

[Series 8: (person_name) · coronal · 0.36mm/px · 3 of 85 slices shown]
[im 22/85  bone]
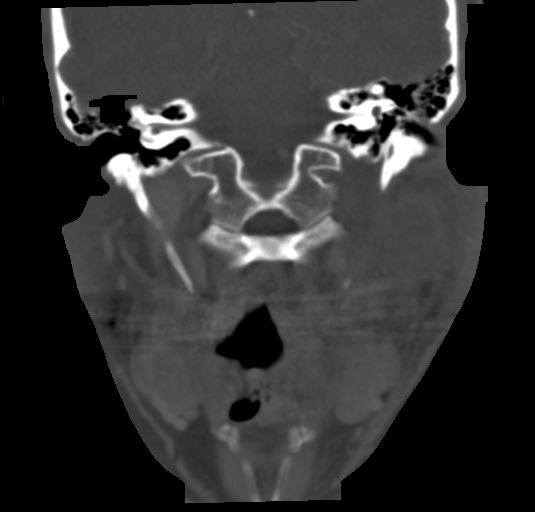
[im 43/85  bone]
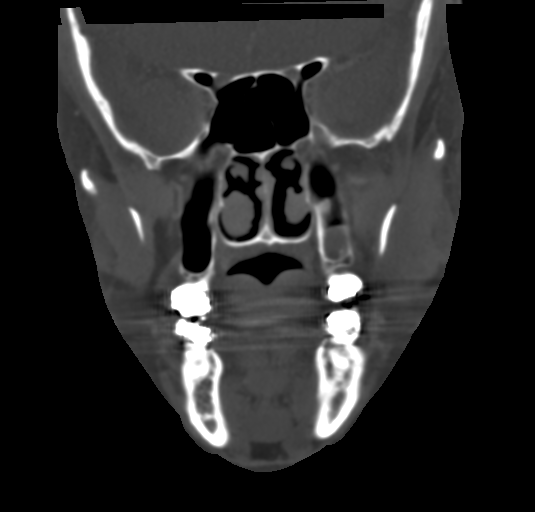
[im 64/85  bone]
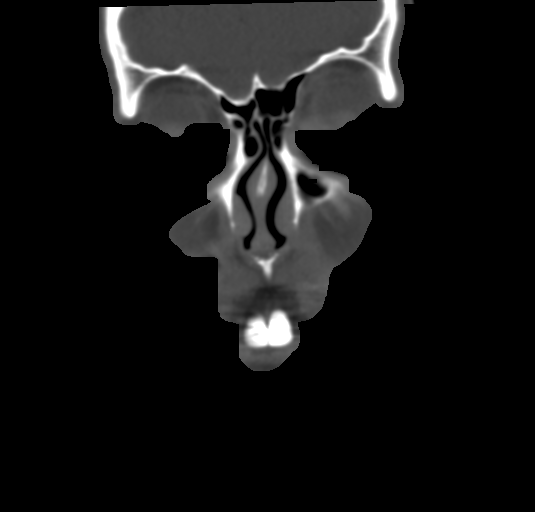

[Series 10: bone sag (person_name) · sagittal · 0.35mm/px · 2 of 95 slices shown]
[im 32/95  bone]
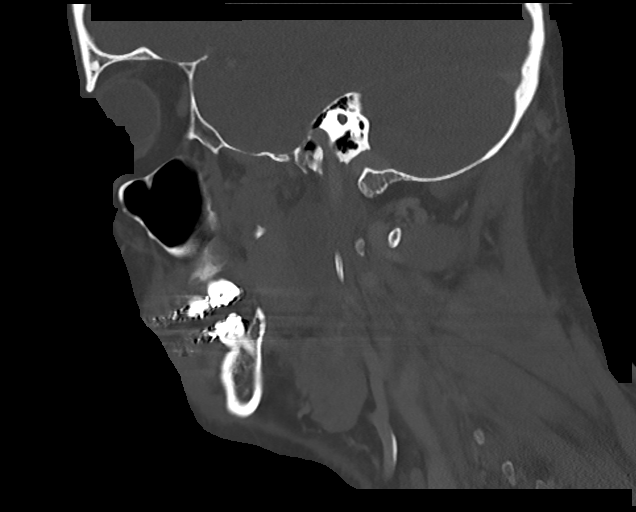
[im 63/95  bone]
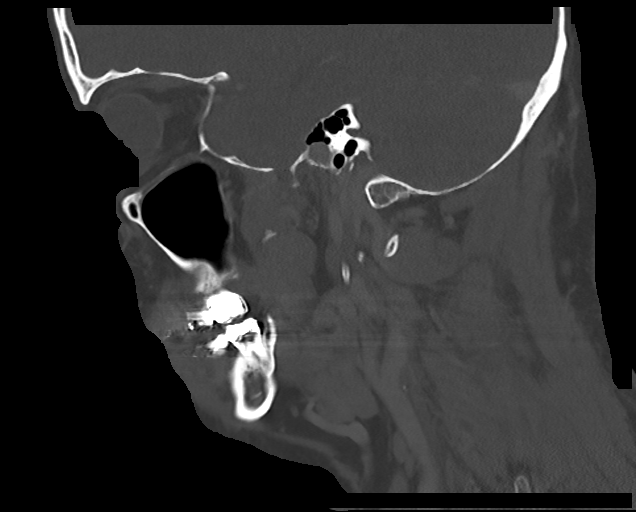

[15 of 47 positions shown; findings below may reference images not displayed]

FINDINGS: Osseous: No fracture or mandibular dislocation. No destructive
process. Skull base foramina appear patent and normal.

Orbits: Negative. No traumatic or inflammatory finding.

Sinuses: Clear.

Soft tissues: There is a large mass in the LEFT prestyloid
parapharyngeal space, relatively low attenuation, measuring
approximately 15 x 46 x 29 mm. See series 3, image 46. Trigeminal V3
division schwannoma is one consideration; (most schwannomas are
located in the poststyloid compartment.) Deep lobe parotid tumor is
not excluded. An unusual or metastatic lymph node in this location
is an additional consideration. No enhancement to suggest vascular
process. The superficial lobe of the LEFT parotid bulges laterally,
and demonstrates slight asymmetric enhancement, likely as a
secondary phenomenon. Infectious parotitis is possible, but less
likely.

Limited intracranial: Negative.
IMPRESSION: 1. 15 x 46 x 29 mm low attenuation mass in the LEFT prestyloid
parapharyngeal space, relatively low attenuation. Schwannoma, deep
lobe parotid tumor, or unusual/metastatic lymph node all are
differential considerations. Elective ENT consultation may be
warranted.
2. Superficial lobe of LEFT parotid bulges laterally in demonstrates
slight asymmetric enhancement, likely as a secondary phenomenon.

## 2023-09-14 ENCOUNTER — Encounter: Payer: Self-pay | Admitting: Gastroenterology

## 2023-09-24 ENCOUNTER — Encounter: Payer: Self-pay | Admitting: Gastroenterology

## 2023-11-07 ENCOUNTER — Ambulatory Visit (AMBULATORY_SURGERY_CENTER): Payer: PRIVATE HEALTH INSURANCE

## 2023-11-07 VITALS — Ht 75.5 in | Wt 217.0 lb

## 2023-11-07 DIAGNOSIS — Z85038 Personal history of other malignant neoplasm of large intestine: Secondary | ICD-10-CM

## 2023-11-07 MED ORDER — NA SULFATE-K SULFATE-MG SULF 17.5-3.13-1.6 GM/177ML PO SOLN: 1.0000 | Freq: Once | ORAL | 0 refills | Status: AC

## 2023-11-07 NOTE — Progress Notes (Signed)

## 2023-11-18 ENCOUNTER — Encounter: Payer: Self-pay | Admitting: Gastroenterology

## 2023-11-18 ENCOUNTER — Ambulatory Visit: Payer: PRIVATE HEALTH INSURANCE | Admitting: Gastroenterology

## 2023-11-18 VITALS — BP 108/69 | HR 70 | Temp 97.8°F | Resp 12 | Ht 75.5 in | Wt 217.2 lb

## 2023-11-18 DIAGNOSIS — K648 Other hemorrhoids: Secondary | ICD-10-CM

## 2023-11-18 DIAGNOSIS — K5730 Diverticulosis of large intestine without perforation or abscess without bleeding: Secondary | ICD-10-CM

## 2023-11-18 DIAGNOSIS — Z85038 Personal history of other malignant neoplasm of large intestine: Secondary | ICD-10-CM

## 2023-11-18 DIAGNOSIS — Z87891 Personal history of nicotine dependence: Secondary | ICD-10-CM

## 2023-11-18 DIAGNOSIS — L818 Other specified disorders of pigmentation: Secondary | ICD-10-CM | POA: Diagnosis not present

## 2023-11-18 DIAGNOSIS — K635 Polyp of colon: Secondary | ICD-10-CM | POA: Diagnosis not present

## 2023-11-18 DIAGNOSIS — D122 Benign neoplasm of ascending colon: Secondary | ICD-10-CM

## 2023-11-18 DIAGNOSIS — Z1211 Encounter for screening for malignant neoplasm of colon: Secondary | ICD-10-CM

## 2023-11-18 DIAGNOSIS — D124 Benign neoplasm of descending colon: Secondary | ICD-10-CM

## 2023-11-18 MED ORDER — SODIUM CHLORIDE 0.9 % IV SOLN: 500.0000 mL | INTRAVENOUS | Status: AC

## 2023-11-18 NOTE — Progress Notes (Signed)
 History and Physical:  This patient presents for endoscopic testing for: Encounter Diagnosis  Name Primary?   Personal history of colon cancer Yes    58 year old man here today for surveillance colonoscopy with a history of a malignant colon polyp removed in August 2019.  No surgery or additional oncologic treatment was required.  No polyps 12/2018  No polyps 08/2020  Patient denies chronic abdominal pain, rectal bleeding, constipation or diarrhea.   Patient is otherwise without complaints or active issues today.   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     finasteride (PROPECIA) 1 MG tablet Take 1 mg by mouth every morning.      levocetirizine (XYZAL) 5 MG tablet Take 5 mg by mouth every evening.     lisinopril (PRINIVIL,ZESTRIL) 10 MG tablet Take 10 mg by mouth every morning.   1   metFORMIN (GLUCOPHAGE) 1000 MG tablet Take 1,000 mg by mouth daily with breakfast. (Patient taking differently: Take 1,000 mg by mouth in the morning and at bedtime.)     Multiple Vitamins-Minerals (ONE-A-DAY MENS, MINERALS,) TABS Take 1 tablet by mouth daily.     pravastatin (PRAVACHOL) 10 MG tablet Take 10 mg by mouth every morning.  (Patient taking differently: Take 10 mg by mouth every other day.)  1   sitaGLIPtin (JANUVIA) 100 MG tablet Take 100 mg by mouth every morning.     cyclobenzaprine  (FLEXERIL) 5 MG tablet Take 5 mg by mouth every 8 (eight) hours as needed.     predniSONE (DELTASONE) 20 MG tablet Take 20 mg by mouth as needed (Poision Ivy). (Patient not taking: Reported on 11/18/2023)     Current Facility-Administered Medications  Medication Dose Route Frequency Provider Last Rate Last Admin   0.9 %  sodium chloride  infusion  500 mL Intravenous Continuous Danis, Victory CROME III, MD          ___________________________________________________________________ Objective   Exam:  BP (!) 134/97   Pulse 88   Temp 97.8 F (36.6 C) (Temporal)   Ht 6' 3.5 (1.918 m)   Wt 217 lb 3.2 oz (98.5 kg)   SpO2 98%   BMI 26.79 kg/m   CV: regular , S1/S2 Resp: clear to auscultation bilaterally, normal RR and effort noted GI: soft, no tenderness, with active bowel sounds.   Assessment: Encounter Diagnosis  Name Primary?   Personal history of colon cancer Yes     Plan: Colonoscopy   The benefits and risks of the planned procedure(s) were described in detail with the patient or (when appropriate) their health care proxy.  Risks were outlined as including, but not limited to, bleeding, infection, perforation, adverse medication reaction leading to cardiac or pulmonary decompensation, pancreatitis (if ERCP).  The limitation of incomplete mucosal visualization was also discussed.  No guarantees or warranties  were given.  The patient is appropriate for an endoscopic procedure in the ambulatory setting.   - Victory Brand, MD

## 2023-11-18 NOTE — Patient Instructions (Signed)
 YOU HAD AN ENDOSCOPIC PROCEDURE TODAY AT THE [facility]:   Refer to the procedure report that was given to you for any specific questions about what was found during the examination.  If the procedure report does not answer your questions, please call your gastroenterologist to clarify.  If you requested that your care partner not be given the details of your procedure findings, then the procedure report has been included in a sealed envelope for you to review at your convenience later.  YOU SHOULD EXPECT: Some feelings of bloating in the abdomen. Passage of more gas than usual.  Walking can help get rid of the air that was put into your GI tract during the procedure and reduce the bloating. If you had a lower endoscopy (such as a colonoscopy or flexible sigmoidoscopy) you may notice spotting of blood in your stool or on the toilet paper. If you underwent a bowel prep for your procedure, you may not have a normal bowel movement for a few days.  Please Note:  You might notice some irritation and congestion in your nose or some drainage.  This is from the oxygen used during your procedure.  There is no need for concern and it should clear up in a day or so.  SYMPTOMS TO REPORT IMMEDIATELY:  Following lower endoscopy (colonoscopy or flexible sigmoidoscopy):  Excessive amounts of blood in the stool  Significant tenderness or worsening of abdominal pains  Swelling of the abdomen that is new, acute  Fever of 100F or higher   For urgent or emergent issues, a gastroenterologist can be reached at any hour by calling [phone]. Do not use MyChart messaging for urgent concerns.    DIET:  We do recommend a small meal at first, but then you may proceed to your regular diet.  Drink plenty of fluids but you should avoid alcoholic beverages for 24 hours.  MEDICATIONS: Continue present medications.  FOLLOW UP: Await pathology results. Repeat colonoscopy is recommended for surveillance. The  colonoscopy date will be determined after pathology results from today's exam become available for review.  Educational handouts given to patient: Polyps, Diverticulosis, Hemorrhoids.  Thank you for allowing us  to provide for your healthcare needs today.  ACTIVITY:  You should plan to take it easy for the rest of today and you should NOT DRIVE or use heavy machinery until tomorrow (because of the sedation medicines used during the test).    FOLLOW UP: Our staff will call the number listed on your records the next business day following your procedure.  We will call around 7:15- 8:00 am to check on you and address any questions or concerns that you may have regarding the information given to you following your procedure. If we do not reach you, we will leave a message.     If any biopsies were taken you will be contacted by phone or by letter within the next 1-3 weeks.  Please call us  at [phone] if you have not heard about the biopsies in 3 weeks.    SIGNATURES/CONFIDENTIALITY: You and/or your care partner have signed paperwork which will be entered into your electronic medical record.  These signatures attest to the fact that that the information above on your After Visit Summary has been reviewed and is understood.  Full responsibility of the confidentiality of this discharge information lies with you and/or your care-partner.

## 2023-11-18 NOTE — Progress Notes (Signed)
 Pt's states no medical or surgical changes since previsit or office visit.

## 2023-11-18 NOTE — Progress Notes (Signed)
 Called to room to assist during endoscopic procedure.  Patient ID and intended procedure confirmed with present staff. Received instructions for my participation in the procedure from the performing physician.

## 2023-11-18 NOTE — Op Note (Signed)
 [facility] Patient Name: Luke Woodard Procedure Date: 11/18/2023 9:26 AM MRN: [id number] Endoscopist: Victory L. Legrand , MD, [id number] Age: 58 Referring MD:  Date of Birth: 1965-12-21 Gender: Male Account #: [id number] Procedure:                Colonoscopy Indications:              High risk colon cancer surveillance: Personal                            history of colon cancer                           Malignant sigmoid polyp removed August 2019 -no                            surgery or additional treatment needed                           No polyps November 2020 or July 2022 Medicines:                Monitored Anesthesia Care Procedure:                Pre-Anesthesia Assessment:                           - Prior to the procedure, a History and Physical                            was performed, and patient medications and                            allergies were reviewed. The patient's tolerance of                            previous anesthesia was also reviewed. The risks                            and benefits of the procedure and the sedation                            options and risks were discussed with the patient.                            All questions were answered, and informed consent                            was obtained. Prior Anticoagulants: The patient has                            taken no anticoagulant or antiplatelet agents. ASA                            Grade Assessment: II - A patient with mild systemic  disease. After reviewing the risks and benefits,                            the patient was deemed in satisfactory condition to                            undergo the procedure.                           After obtaining informed consent, the colonoscope                            was passed under direct vision. Throughout the                            procedure, the patient's blood pressure, pulse, and                             oxygen saturations were monitored continuously. The                            Olympus Scope [id number] was introduced through the                            anus and advanced to the the cecum, identified by                            appendiceal orifice and ileocecal valve. The                            colonoscopy was performed without difficulty. The                            patient tolerated the procedure well. The quality                            of the bowel preparation was excellent. The                            ileocecal valve, appendiceal orifice, and rectum                            were photographed. Scope In: 9:39:20 AM Scope Out: 9:55:17 AM Scope Withdrawal Time: 0 hours 12 minutes 9 seconds  Total Procedure Duration: 0 hours 15 minutes 57 seconds  Findings:                 The perianal and digital rectal examinations were                            normal.                           Repeat examination of right colon under NBI  performed.                           Two semi-sessile polyps were found in the ascending                            colon. The polyps were diminutive in size. These                            polyps were removed with a cold snare. Resection                            and retrieval were complete.                           A diminutive polyp was found in the descending                            colon. The polyp was sessile. The polyp was removed                            with a cold snare. Resection and retrieval were                            complete.                           A few small-mouthed diverticula were found in the                            left colon.                           Two tattoos were seen in the sigmoid colon. The                            tattoo sites and area in between appeared normal.                           Internal hemorrhoids were found.                           The exam was  otherwise without abnormality on                            direct and retroflexion views. Complications:            No immediate complications. Estimated Blood Loss:     Estimated blood loss was minimal. Impression:               - Two diminutive polyps in the ascending colon,                            removed with a cold snare. Resected and retrieved.                           -  One diminutive polyp in the descending colon,                            removed with a cold snare. Resected and retrieved.                           - Diverticulosis in the left colon.                           - A tattoo was seen in the sigmoid colon. The                            tattoo site appeared normal.                           - Internal hemorrhoids.                           - The examination was otherwise normal on direct                            and retroflexion views. Recommendation:           - Patient has a contact number available for                            emergencies. The signs and symptoms of potential                            delayed complications were discussed with the                            patient. Return to normal activities tomorrow.                            Written discharge instructions were provided to the                            patient.                           - Resume previous diet.                           - Continue present medications.                           - Await pathology results.                           - Repeat colonoscopy is recommended for                            surveillance. The colonoscopy date will be                            determined after pathology  results from today's                            exam become available for review. Ayliana Casciano L. Legrand, MD 11/18/2023 10:03:46 AM This report has been signed electronically.

## 2023-11-18 NOTE — Progress Notes (Signed)
 Sedate, gd SR, tolerated procedure well, VSS, report to RN

## 2023-11-19 ENCOUNTER — Telehealth: Payer: Self-pay | Admitting: *Deleted

## 2023-11-19 NOTE — Telephone Encounter (Signed)
  Follow up Call-     11/18/2023    8:52 AM  Call back number  Post procedure Call Back phone  # [phone]  Permission to leave phone message Yes    Left message on machine to call back if any questions or concerns

## 2023-11-20 ENCOUNTER — Ambulatory Visit: Payer: Self-pay | Admitting: Gastroenterology

## 2023-11-20 LAB — SURGICAL PATHOLOGY
